# Patient Record
Sex: Male | Born: 1990
Health system: Southern US, Community
[De-identification: ages and names within clinical notes are randomized; demographics above are authoritative.]

## PROBLEM LIST (undated history)

## (undated) DIAGNOSIS — F329 Major depressive disorder, single episode, unspecified: Secondary | ICD-10-CM

## (undated) DIAGNOSIS — E079 Disorder of thyroid, unspecified: Secondary | ICD-10-CM

## (undated) DIAGNOSIS — E0500 Thyrotoxicosis with diffuse goiter without thyrotoxic crisis or storm: Secondary | ICD-10-CM

## (undated) DIAGNOSIS — F319 Bipolar disorder, unspecified: Secondary | ICD-10-CM

## (undated) DIAGNOSIS — F32A Depression, unspecified: Secondary | ICD-10-CM

## (undated) HISTORY — PX: ANKLE FRACTURE SURGERY: SHX122

---
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
## 2017-07-21 ENCOUNTER — Emergency Department (HOSPITAL_COMMUNITY): Payer: BLUE CROSS/BLUE SHIELD

## 2017-07-21 ENCOUNTER — Encounter (HOSPITAL_COMMUNITY): Payer: Self-pay

## 2017-07-21 ENCOUNTER — Other Ambulatory Visit: Payer: Self-pay

## 2017-07-21 ENCOUNTER — Emergency Department (HOSPITAL_COMMUNITY)
Admission: EM | Admit: 2017-07-21 | Discharge: 2017-07-21 | Disposition: A | Discharge status: Home / Self Care | Payer: BLUE CROSS/BLUE SHIELD | Attending: Physician Assistant | Admitting: Physician Assistant

## 2017-07-21 DIAGNOSIS — Z79899 Other long term (current) drug therapy: Secondary | ICD-10-CM | POA: Diagnosis not present

## 2017-07-21 DIAGNOSIS — R42 Dizziness and giddiness: Secondary | ICD-10-CM

## 2017-07-21 DIAGNOSIS — F17210 Nicotine dependence, cigarettes, uncomplicated: Secondary | ICD-10-CM | POA: Diagnosis not present

## 2017-07-21 DIAGNOSIS — F419 Anxiety disorder, unspecified: Secondary | ICD-10-CM | POA: Diagnosis present

## 2017-07-21 DIAGNOSIS — R0789 Other chest pain: Secondary | ICD-10-CM

## 2017-07-21 HISTORY — DX: Major depressive disorder, single episode, unspecified: F32.9

## 2017-07-21 HISTORY — DX: Depression, unspecified: F32.A

## 2017-07-21 HISTORY — DX: Bipolar disorder, unspecified: F31.9

## 2017-07-21 LAB — CBC WITH DIFFERENTIAL/PLATELET
Basophils Absolute: 0 10*3/uL (ref 0.0–0.1)
Basophils Relative: 1 %
Eosinophils Absolute: 0.1 10*3/uL (ref 0.0–0.7)
Eosinophils Relative: 1 %
HCT: 43.1 % (ref 39.0–52.0)
Hemoglobin: 14.9 g/dL (ref 13.0–17.0)
Lymphocytes Relative: 37 %
Lymphs Abs: 2.3 10*3/uL (ref 0.7–4.0)
MCH: 31.2 pg (ref 26.0–34.0)
MCHC: 34.6 g/dL (ref 30.0–36.0)
MCV: 90.2 fL (ref 78.0–100.0)
Monocytes Absolute: 0.7 10*3/uL (ref 0.1–1.0)
Monocytes Relative: 12 %
Neutro Abs: 3.1 10*3/uL (ref 1.7–7.7)
Neutrophils Relative %: 49 %
Platelets: 307 10*3/uL (ref 150–400)
RBC: 4.78 MIL/uL (ref 4.22–5.81)
RDW: 12.4 % (ref 11.5–15.5)
WBC: 6.2 10*3/uL (ref 4.0–10.5)

## 2017-07-21 LAB — COMPREHENSIVE METABOLIC PANEL
ALT: 18 U/L (ref 17–63)
AST: 26 U/L (ref 15–41)
Albumin: 4.4 g/dL (ref 3.5–5.0)
Alkaline Phosphatase: 89 U/L (ref 38–126)
Anion gap: 8 (ref 5–15)
BUN: 9 mg/dL (ref 6–20)
CO2: 24 mmol/L (ref 22–32)
Calcium: 9.6 mg/dL (ref 8.9–10.3)
Chloride: 105 mmol/L (ref 101–111)
Creatinine, Ser: 1.13 mg/dL (ref 0.61–1.24)
GFR calc Af Amer: >60 mL/min (ref >60)
GFR calc non Af Amer: >60 mL/min (ref >60)
Glucose, Bld: 99 mg/dL (ref 65–99)
Potassium: 4.1 mmol/L (ref 3.5–5.1)
Sodium: 137 mmol/L (ref 135–145)
Total Bilirubin: 0.6 mg/dL (ref 0.3–1.2)
Total Protein: 7.5 g/dL (ref 6.5–8.1)

## 2017-07-21 LAB — I-STAT TROPONIN, ED: Troponin i, poc: 0 ng/mL (ref 0.00–0.08)

## 2017-07-21 MED ORDER — HYDROXYZINE HCL 25 MG PO TABS: 25.0000 mg | ORAL_TABLET | Freq: Four times a day (QID) | ORAL | 0 refills | Status: DC

## 2017-07-21 MED ORDER — HYDROXYZINE HCL 25 MG PO TABS
25.0000 mg | ORAL_TABLET | Freq: Once | ORAL | Status: AC
  Administered 2017-07-21: 25 mg via ORAL
  Filled 2017-07-21: qty 1

## 2017-07-21 NOTE — ED Notes (Signed)
Pt discharged from ED; instructions provided and scripts given; Pt encouraged to return to ED if symptoms worsen and to f/u with PCP; Pt verbalized understanding of all instructions 

## 2017-07-21 NOTE — ED Provider Notes (Signed)
[facility]   CSN: [id number] Arrival date & time: 07/21/17  1502     History   Chief Complaint Chief Complaint  Patient presents with  . Anxiety    HPI Ralph Harrell is a 27 y.o. male.  Ralph Harrell is a 27 y.o. Male with a history of bipolar 1 disorder, depression and anxiety, who presents to the ED for evaluation of some increased anxiety with some intermittent chest pains and feeling dizzy.  Patient reports he has a pain that starts at the top of his left chest and he feels like it "flushes downward" and then he starts to feel slightly lightheaded, but denies any syncopal episodes.  He reports this episode has happened a few times over the past 2 days and he has been feeling increasingly anxious.  Patient reports some occasional tingling that is not focal.  He reports feeling a little bit queasy over the past few days but has not had any episodes of vomiting currently he denies any chest pain, shortness of breath, abdominal pain he has not had any fevers or chills, no cough.  Patient reports in the past he has been on Prozac and Wellbutrin to help manage his mental health but he has been out without these medications for about a year and a half due to lack of insurance.  No prior history of cardiac events or heart problems     Past Medical History:  Diagnosis Date  . Bipolar 1 disorder (HCC)   . Depression     There are no active problems to display for this patient.   History reviewed. No pertinent surgical history.      Home Medications    Prior to Admission medications   Medication Sig Start Date End Date Taking? Authorizing Provider  acetaminophen (TYLENOL) 500 MG tablet Take 1,000 mg by mouth every 6 (six) hours as needed for headache (pain).   Yes [provider]  ibuprofen (ADVIL,MOTRIN) 200 MG tablet Take 600 mg by mouth every 6 (six) hours as needed for headache (pain).   Yes [provider]  Multiple Vitamin (MULTIVITAMIN WITH MINERALS) TABS tablet Take 1 tablet by mouth daily.   Yes [provider]  OVER THE COUNTER MEDICATION Kre-Alkalyn (creatine supplement) - take 2 capsules by mouth before workouts (3 or 4 times weekly)   Yes [provider]    Family History No family history on file.  Social History Social History   Tobacco Use  . Smoking status: Current Every Day Smoker    Types: Cigarettes  . Smokeless tobacco: Never Used  Substance Use Topics  . Alcohol use: Yes  . Drug use: Not Currently     Allergies   Patient has no known allergies.   Review of Systems Review of Systems  Constitutional: Negative for chills and fever.  HENT: Negative for congestion, rhinorrhea and sore throat.   Eyes: Negative for visual disturbance.  Respiratory: Positive for chest tightness. Negative for shortness of breath and wheezing.   Cardiovascular: Positive for chest pain. Negative for palpitations and leg swelling.  Gastrointestinal: Positive for nausea. Negative for abdominal pain and vomiting.  Genitourinary: Negative for dysuria and frequency.  Musculoskeletal: Negative for arthralgias and myalgias.  Skin: Negative for color change and rash.  Neurological: Positive for light-headedness. Negative for dizziness, syncope, weakness, numbness and headaches.  Psychiatric/Behavioral: The patient is nervous/anxious.      Physical Exam Updated Vital Signs BP (!) 141/81  Pulse 68   Temp 97.6 F (36.4 C) (Oral)   Resp 16   Ht 5\' 11"  (1.803 m)   Wt 71.7 kg (158 lb)   SpO2 100%   BMI 22.04 kg/m   Physical Exam  Constitutional: He appears well-developed and well-nourished. No distress.  Appears anxious but is in no acute distress  HENT:  Head: Normocephalic and atraumatic.  Mouth/Throat: Oropharynx is clear and moist.  Eyes: Right eye exhibits no discharge. Left eye exhibits no discharge.  Neck: Normal range of motion. Neck supple.   Cardiovascular: Normal rate, regular rhythm, normal heart sounds and intact distal pulses. Exam reveals no gallop and no friction rub.  No murmur heard. Pulses:      Radial pulses are 2+ on the right side, and 2+ on the left side.       Dorsalis pedis pulses are 2+ on the right side, and 2+ on the left side.  Pulmonary/Chest: Effort normal and breath sounds normal. No stridor. No respiratory distress. He has no wheezes. He has no rales.  Respirations equal and unlabored, patient able to speak in full sentences, lungs clear to auscultation bilaterally  Abdominal: Soft. Bowel sounds are normal. He exhibits no distension and no mass. There is no tenderness. There is no guarding.  Neurological: He is alert. Coordination normal.  Skin: He is not diaphoretic.  Psychiatric: His behavior is normal. His mood appears anxious.  Nursing note and vitals reviewed.    ED Treatments / Results  Labs (all labs ordered are listed, but only abnormal results are displayed) Labs Reviewed  COMPREHENSIVE METABOLIC PANEL  CBC WITH DIFFERENTIAL/PLATELET  I-STAT TROPONIN, ED    EKG EKG Interpretation  Date/Time:  Saturday July 21 2017 15:14:58 EDT Ventricular Rate:  69 PR Interval:  130 QRS Duration: 100 QT Interval:  380 QTC Calculation: 407 R Axis:   83 Text Interpretation:  Normal sinus rhythm Normal ECG Normal sinus rhythm Confirmed by Corlis Leak, Courteney ([id number]) on 07/21/2017 6:16:20 PM   Radiology Dg Chest 2 View  Result Date: 07/21/2017 CLINICAL DATA:  Pt arrived from home with c/o feeling like he was going to pass out. Pt states he feels this way when sitting standing or laying and reports he has been feeling more anxious. Pt also c/o tingling on left side since yesterday off and on. EXAM: CHEST - 2 VIEW COMPARISON:  06/16/2008 FINDINGS: The heart size and mediastinal contours are within normal limits. Both lungs are clear. The visualized skeletal structures are unremarkable. IMPRESSION: No  active cardiopulmonary disease. Electronically Signed   By: Norva Pavlov M.D.   On: 07/21/2017 17:25    Procedures Procedures (including critical care time)  Medications Ordered in ED Medications  hydrOXYzine (ATARAX/VISTARIL) tablet 25 mg (25 mg Oral Given 07/21/17 1841)     Initial Impression / Assessment and Plan / ED Course  I have reviewed the triage vital signs and the nursing notes.  Pertinent labs & imaging results that were available during my care of the patient were reviewed by me and considered in my medical decision making (see chart for details).  Patient presents with very atypical chest pain intermittently over the past 2 days that starts at the left apex of the chest wall and flushes downwards, this is associated with increased anxiety patient has been experiencing some intermittent dizziness.  Vitals are normal here in the ED patient is not orthostatic.  Chest pain is not likely of cardiac or pulmonary etiology d/t presentation, PERC negative, VSS, no  tracheal deviation, no JVD or new murmur, RRR, breath sounds equal bilaterally, EKG without acute abnormalities, negative troponin, and negative CXR.  Patient definitely appears anxious on exam has been off of his psychiatric medications for the last year and a half due to insurance issues but recently got back on his insurance and will be following up with his primary care doctor.  He was given a dose of Vistaril here today which seemed to help, will discharge with short course of this medication to trial at home.  Patient is to be discharged with recommendation to follow up with PCP in regards to today's hospital visit. Pt has been advised to return to the ED if CP becomes exertional, associated with diaphoresis or nausea, radiates to left jaw/arm, worsens or becomes concerning in any way. Pt appears reliable for follow up and is agreeable to discharge.     Final Clinical Impressions(s) / ED Diagnoses   Final diagnoses:    Atypical chest pain  Lightheadedness  Anxiety    ED Discharge Orders        Ordered    hydrOXYzine (ATARAX/VISTARIL) 25 MG tablet  Every 6 hours     07/21/17 1841       Dartha Lodge, PA-C 07/21/17 1850    Abelino Derrick, MD 07/21/17 2337

## 2017-07-21 NOTE — ED Notes (Signed)
Pt given sandwich bag and coke per Tray(RN)

## 2017-07-21 NOTE — Discharge Instructions (Addendum)
Your evaluation today is reassuring, labs, EKG and chest x-ray do not show any acute heart or lung problem causing your symptoms, anxiety may be contributing, you can continue to use hydroxyzine as needed to help with symptoms.  Please follow-up with your primary care doctor for continued management of this and to help get back on your regular medications.  Return to the ED for worsening chest pain especially if it is present with exertion or radiates to the arm neck or jaw, shortness of breath, fevers or chills, you pass out, or any other new or concerning symptoms.

## 2017-07-21 NOTE — ED Triage Notes (Signed)
Pt arrived from home with c/o feeling like he was going to pass out. Pt states he feels this way when sitting standing or laying and reports he has been feeling more anxious. Pt also c/o tingling on left side since yesterday off and on.

## 2017-12-14 ENCOUNTER — Other Ambulatory Visit: Payer: Self-pay

## 2017-12-14 ENCOUNTER — Encounter (HOSPITAL_COMMUNITY): Payer: Self-pay | Admitting: *Deleted

## 2017-12-14 ENCOUNTER — Emergency Department (HOSPITAL_COMMUNITY)
Admission: EM | Admit: 2017-12-14 | Discharge: 2017-12-14 | Discharge status: Against Medical Advice | Payer: BLUE CROSS/BLUE SHIELD | Attending: Emergency Medicine | Admitting: Emergency Medicine

## 2017-12-14 DIAGNOSIS — R110 Nausea: Secondary | ICD-10-CM | POA: Diagnosis not present

## 2017-12-14 DIAGNOSIS — Z5321 Procedure and treatment not carried out due to patient leaving prior to being seen by health care provider: Secondary | ICD-10-CM | POA: Insufficient documentation

## 2017-12-14 DIAGNOSIS — H9202 Otalgia, left ear: Secondary | ICD-10-CM | POA: Insufficient documentation

## 2017-12-14 NOTE — ED Triage Notes (Signed)
Pt in c/o left earache and drainage for the last few days, states his ear feels full, also had a heat flash this morning that started in the bottom of his stomach and radiated up, also reports increased burping, and nausea after drinking water

## 2017-12-19 ENCOUNTER — Other Ambulatory Visit: Payer: Self-pay

## 2017-12-19 ENCOUNTER — Encounter (HOSPITAL_COMMUNITY): Payer: Self-pay | Admitting: Emergency Medicine

## 2017-12-19 ENCOUNTER — Emergency Department (HOSPITAL_COMMUNITY)
Admission: EM | Admit: 2017-12-19 | Discharge: 2017-12-19 | Disposition: A | Discharge status: Home / Self Care | Payer: BLUE CROSS/BLUE SHIELD | Attending: Emergency Medicine | Admitting: Emergency Medicine

## 2017-12-19 DIAGNOSIS — Z79899 Other long term (current) drug therapy: Secondary | ICD-10-CM | POA: Insufficient documentation

## 2017-12-19 DIAGNOSIS — R42 Dizziness and giddiness: Secondary | ICD-10-CM

## 2017-12-19 DIAGNOSIS — R55 Syncope and collapse: Secondary | ICD-10-CM | POA: Diagnosis present

## 2017-12-19 DIAGNOSIS — F17210 Nicotine dependence, cigarettes, uncomplicated: Secondary | ICD-10-CM | POA: Diagnosis not present

## 2017-12-19 LAB — URINALYSIS, ROUTINE W REFLEX MICROSCOPIC
Bilirubin Urine: NEGATIVE
GLUCOSE, UA: NEGATIVE mg/dL
Hgb urine dipstick: NEGATIVE
Ketones, ur: NEGATIVE mg/dL
LEUKOCYTES UA: NEGATIVE
Nitrite: NEGATIVE
PH: 8 (ref 5.0–8.0)
Protein, ur: NEGATIVE mg/dL
SPECIFIC GRAVITY, URINE: 1.001 — AB (ref 1.005–1.030)

## 2017-12-19 LAB — BASIC METABOLIC PANEL
Anion gap: 14 (ref 5–15)
BUN: 9 mg/dL (ref 6–20)
CHLORIDE: 104 mmol/L (ref 98–111)
CO2: 20 mmol/L — ABNORMAL LOW (ref 22–32)
Calcium: 9.8 mg/dL (ref 8.9–10.3)
Creatinine, Ser: 1.25 mg/dL — ABNORMAL HIGH (ref 0.61–1.24)
GFR calc Af Amer: >60 mL/min (ref >60)
GFR calc non Af Amer: >60 mL/min (ref >60)
Glucose, Bld: 92 mg/dL (ref 70–99)
POTASSIUM: 3.9 mmol/L (ref 3.5–5.1)
SODIUM: 138 mmol/L (ref 135–145)

## 2017-12-19 LAB — CBC
HEMATOCRIT: 46.7 % (ref 39.0–52.0)
Hemoglobin: 16.7 g/dL (ref 13.0–17.0)
MCH: 31.3 pg (ref 26.0–34.0)
MCHC: 35.8 g/dL (ref 30.0–36.0)
MCV: 87.6 fL (ref 78.0–100.0)
Platelets: 322 10*3/uL (ref 150–400)
RBC: 5.33 MIL/uL (ref 4.22–5.81)
RDW: 11.7 % (ref 11.5–15.5)
WBC: 7.2 10*3/uL (ref 4.0–10.5)

## 2017-12-19 LAB — RAPID URINE DRUG SCREEN, HOSP PERFORMED
AMPHETAMINES: NOT DETECTED
BARBITURATES: NOT DETECTED
BENZODIAZEPINES: NOT DETECTED
Cocaine: NOT DETECTED
Opiates: NOT DETECTED
TETRAHYDROCANNABINOL: NOT DETECTED

## 2017-12-19 MED ORDER — LACTATED RINGERS IV BOLUS
1000.0000 mL | Freq: Once | INTRAVENOUS | Status: AC
  Administered 2017-12-19: 1000 mL via INTRAVENOUS

## 2017-12-19 NOTE — ED Notes (Signed)
Pt refused IVF, given ice water PO, tolerating well

## 2017-12-19 NOTE — ED Notes (Signed)
No E-signature available; pt is stable for discharge and states understanding of discharge instructions  

## 2017-12-19 NOTE — ED Notes (Signed)
ED Provider at bedside. 

## 2017-12-19 NOTE — ED Triage Notes (Signed)
Pt presents with [facility] from work with reports of syncopal episodes x 2 while driving to work this morning; pt denies wrecking vehicle or injury at this time; also c/o dizziness and blurred vision x 2 wks that intermittently improves; hx of anxiety and bipolar and takes prozac and welbutrin; states he has had some increased stressors lately and no quality sleep  Pt also adds c/o intermittent constipation and can sometimes taste stool in his mouth and his perspiration smells like stool; last BM yesterday

## 2017-12-20 NOTE — ED Provider Notes (Signed)
[facility] Provider [facility]   CSN: [id number] Arrival date & time: 12/19/17  1527     History   Chief Complaint Chief Complaint  Patient presents with  . Loss of Consciousness  . Dizziness  . Anxiety    HPI Ralph Harrell is a 27 y.o. male.  Patient had a couple episodes where he fell exam pass out and then lost memory for short period of time.  1 of having was driving when having noise walking.  He attended he hit his head or do anything else abnormal.   Loss of Consciousness   This is a new problem. The current episode started 1 to 2 hours ago. The problem has been resolved. There was no loss of consciousness. The problem is associated with normal activity. Associated symptoms include dizziness.  Dizziness  Associated symptoms: syncope   Anxiety     Past Medical History:  Diagnosis Date  . Bipolar 1 disorder (HCC)   . Depression     There are no active problems to display for this patient.   History reviewed. No pertinent surgical history.      Home Medications    Prior to Admission medications   Medication Sig Start Date End Date Taking? Authorizing Provider  acetaminophen (TYLENOL) 500 MG tablet Take 1,000 mg by mouth every 6 (six) hours as needed for headache (pain).   Yes [provider]  buPROPion (WELLBUTRIN SR) 150 MG 12 hr tablet Take 150 mg by mouth every 12 (twelve) hours. 12/14/17  Yes [provider]  FLUoxetine (PROZAC) 40 MG capsule Take 40 mg by mouth daily. 12/14/17  Yes [provider]  ibuprofen (ADVIL,MOTRIN) 200 MG tablet Take 600 mg by mouth every 6 (six) hours as needed for headache (pain).   Yes [provider]  OVER THE COUNTER MEDICATION Kre-Alkalyn (creatine supplement) - take 2 capsules by mouth before workouts (3 or 4 times weekly)    [provider]    Family History No family history on file.  Social History Social History   Tobacco Use  .  Smoking status: Current Every Day Smoker    Types: Cigarettes  . Smokeless tobacco: Never Used  Substance Use Topics  . Alcohol use: Yes  . Drug use: Not Currently     Allergies   Patient has no known allergies.   Review of Systems Review of Systems  Cardiovascular: Positive for syncope.  Neurological: Positive for dizziness.  Psychiatric/Behavioral: Negative for suicidal ideas. The patient is nervous/anxious.   All other systems reviewed and are negative.    Physical Exam Updated Vital Signs BP 128/80 (BP Location: Right Arm)   Pulse 68   Temp 98.1 F (36.7 C) (Oral)   Resp 16   SpO2 99%   Physical Exam  Constitutional: He appears well-developed and well-nourished.  HENT:  Head: Normocephalic and atraumatic.  Neck: Normal range of motion.  Cardiovascular: Normal rate.  Pulmonary/Chest: Effort normal. No respiratory distress.  Abdominal: He exhibits no distension.  Musculoskeletal: Normal range of motion.  Neurological: He is alert.  Nursing note and vitals reviewed.    ED Treatments / Results  Labs (all labs ordered are listed, but only abnormal results are displayed) Labs Reviewed  BASIC METABOLIC PANEL - Abnormal; Notable for the following components:      Result Value   CO2 20 (*)    Creatinine, Ser 1.25 (*)    All other components within normal limits  URINALYSIS, ROUTINE W  REFLEX MICROSCOPIC - Abnormal; Notable for the following components:   Color, Urine COLORLESS (*)    Specific Gravity, Urine 1.001 (*)    All other components within normal limits  CBC  RAPID URINE DRUG SCREEN, HOSP PERFORMED  CBG MONITORING, ED    EKG None  Radiology No results found.  Procedures Procedures (including critical care time)  Medications Ordered in ED Medications  lactated ringers bolus 1,000 mL (0 mLs Intravenous Stopped 12/19/17 1929)     Initial Impression / Assessment and Plan / ED Course  I have reviewed the triage vital signs and the nursing  notes.  Pertinent labs & imaging results that were available during my care of the patient were reviewed by me and considered in my medical decision making (see chart for details).     Low suspicion for any cardiac causes for his symptoms.  No neuro deficits to suspect that related.  Patient thinks that the stress and anxiety might be related to his near syncopal episodes and if this is possible however I encouraged him not to drive and to follow-up with his primary doctor to get further evaluation before deciding on that.  Final Clinical Impressions(s) / ED Diagnoses   Final diagnoses:  Dizziness  Syncope, unspecified syncope type    ED Discharge Orders    None       Terrian Ridlon, Barbara Cower, MD 12/20/17 [phone]

## 2017-12-25 ENCOUNTER — Other Ambulatory Visit (HOSPITAL_COMMUNITY): Payer: Self-pay | Admitting: Family Medicine

## 2017-12-25 DIAGNOSIS — E0590 Thyrotoxicosis, unspecified without thyrotoxic crisis or storm: Secondary | ICD-10-CM

## 2018-01-02 ENCOUNTER — Encounter (HOSPITAL_COMMUNITY)
Admission: RE | Admit: 2018-01-02 | Discharge: 2018-01-02 | Disposition: A | Discharge status: Home / Self Care | Payer: BLUE CROSS/BLUE SHIELD | Source: Ambulatory Visit | Attending: Family Medicine | Admitting: Family Medicine

## 2018-01-02 DIAGNOSIS — E0590 Thyrotoxicosis, unspecified without thyrotoxic crisis or storm: Secondary | ICD-10-CM | POA: Diagnosis not present

## 2018-01-03 ENCOUNTER — Encounter (HOSPITAL_COMMUNITY)
Admission: RE | Admit: 2018-01-03 | Discharge: 2018-01-03 | Disposition: A | Discharge status: Home / Self Care | Payer: BLUE CROSS/BLUE SHIELD | Source: Ambulatory Visit | Attending: Family Medicine | Admitting: Family Medicine

## 2018-01-30 ENCOUNTER — Encounter: Payer: Self-pay | Admitting: Endocrinology

## 2018-01-30 ENCOUNTER — Ambulatory Visit (INDEPENDENT_AMBULATORY_CARE_PROVIDER_SITE_OTHER): Payer: BLUE CROSS/BLUE SHIELD | Admitting: Endocrinology

## 2018-01-30 DIAGNOSIS — E0590 Thyrotoxicosis, unspecified without thyrotoxic crisis or storm: Secondary | ICD-10-CM

## 2018-01-30 DIAGNOSIS — F419 Anxiety disorder, unspecified: Secondary | ICD-10-CM

## 2018-01-30 LAB — T4, FREE: Free T4: 0.97 ng/dL (ref 0.60–1.60)

## 2018-01-30 LAB — TSH: TSH: 0.79 u[IU]/mL (ref 0.35–4.50)

## 2018-01-30 NOTE — Progress Notes (Signed)
Subjective:    Patient ID: Ralph Harrell, male    DOB: 1990/04/08, 27 y.o.   MRN: [id number]  HPI Pt is referred by Lonie Peak, PA, for hyperthyroidism.  Pt reports he was dx'ed with hyperthyroidism 1 month ago.  He took tapazole for just a few days.  He has never had XRT to the anterior neck, or thyroid surgery.  He does not consume kelp or any other non-prescribed thyroid medication.  He has never been on amiodarone.  He reports moderate weight loss (20 lb), and assoc tremor.  sxs are improved on atenolol Past Medical History:  Diagnosis Date  . Bipolar 1 disorder (HCC)   . Depression     No past surgical history on file.  Social History   Socioeconomic History  . Marital status: Married    Spouse name: Not on file  . Number of children: Not on file  . Years of education: Not on file  . Highest education level: Not on file  Occupational History  . Not on file  Social Needs  . Financial resource strain: Not on file  . Food insecurity:    Worry: Not on file    Inability: Not on file  . Transportation needs:    Medical: Not on file    Non-medical: Not on file  Tobacco Use  . Smoking status: Current Every Day Smoker    Types: Cigarettes  . Smokeless tobacco: Never Used  Substance and Sexual Activity  . Alcohol use: Yes  . Drug use: Not Currently  . Sexual activity: Not on file  Lifestyle  . Physical activity:    Days per week: Not on file    Minutes per session: Not on file  . Stress: Not on file  Relationships  . Social connections:    Talks on phone: Not on file    Gets together: Not on file    Attends religious service: Not on file    Active member of club or organization: Not on file    Attends meetings of clubs or organizations: Not on file    Relationship status: Not on file  . Intimate partner violence:    Fear of current or ex partner: Not on file    Emotionally abused: Not on file    Physically abused: Not on file    Forced sexual activity: Not  on file  Other Topics Concern  . Not on file  Social History Narrative  . Not on file    Current Outpatient Medications on File Prior to Visit  Medication Sig Dispense Refill  . acetaminophen (TYLENOL) 500 MG tablet Take 1,000 mg by mouth every 6 (six) hours as needed for headache (pain).    Marland Kitchen atenolol (TENORMIN) 25 MG tablet Take by mouth daily.    Marland Kitchen buPROPion (WELLBUTRIN SR) 150 MG 12 hr tablet Take 150 mg by mouth daily.   1  . FLUoxetine (PROZAC) 40 MG capsule Take 40 mg by mouth daily.  1  . hydrOXYzine (ATARAX/VISTARIL) 50 MG tablet Take 50 mg by mouth every 4 (four) hours as needed.    Marland Kitchen ibuprofen (ADVIL,MOTRIN) 200 MG tablet Take 600 mg by mouth every 6 (six) hours as needed for headache (pain).     No current facility-administered medications on file prior to visit.     No Known Allergies  Family History  Problem Relation Age of Onset  . Thyroid disease Maternal Grandmother     BP 122/72 (BP Location: Left Arm, Patient  Position: Sitting, Cuff Size: Normal)   Pulse 71   Ht 5\' 11"  (1.803 m)   Wt 144 lb (65.3 kg)   SpO2 98%   BMI 20.08 kg/m   Review of Systems denies fever, headache, hoarseness, diplopia, sob, diarrhea, nocturia, muscle weakness, edema, excessive diaphoresis, heat intolerance, easy bruising, and rhinorrhea. He has palpitations, heartburn, and anxiety.     Objective:   Physical Exam VS: see vs page GEN: no distress HEAD: head: no deformity eyes: no periorbital swelling, no proptosis external nose and ears are normal mouth: no lesion seen NECK: supple, thyroid is not enlarged CHEST WALL: no deformity LUNGS: clear to auscultation CV: reg rate and rhythm, no murmur ABD: abdomen is soft, nontender.  no hepatosplenomegaly.  not distended.  no hernia MUSCULOSKELETAL: muscle bulk and strength are grossly normal.  no obvious joint swelling.  gait is normal and steady EXTEMITIES: no deformity.  no edema PULSES: no carotid bruit NEURO:  cn 2-12  grossly intact.   readily moves all 4's.  sensation is intact to touch on all 4's.  Slight tremor of the hands.  SKIN:  Normal texture and temperature.  No rash or suspicious lesion is visible.  Slight diaphoretic.  NODES:  None palpable at the neck PSYCH: alert, well-oriented.  Does not appear anxious nor depressed.     nuc med scan: Mildly elevated 24 hour radio iodine uptake consistent with Hyperthyroidism. Normal thyroid scan.  Findings consistent with Graves disease.  I have reviewed outside records, and summarized: Pt was noted to have suppressed TSH, and referred here.  Main problem addressed was anxiety.  It was uncertain to what extent this was related to the thyroid.       Assessment & Plan:  Grave's Dz, new to me Hyperthyroidism, due to the Grave's Dz.  Mild.  We should recheck prior to rx.  Patient Instructions  blood tests are requested for you today.  We'll let you know about the results. If it is overactive again, i'll prescribe for you the methimazole pill. If ever you have fever while taking methimazole, stop it and call us, even if the reason is obvious, because of the risk of a rare side-effect. Please come back for a follow-up appointment in 1 month.

## 2018-01-30 NOTE — Patient Instructions (Signed)
blood tests are requested for you today.  We'll let you know about the results. If it is overactive again, i'll prescribe for you the methimazole pill. If ever you have fever while taking methimazole, stop it and call us, even if the reason is obvious, because of the risk of a rare side-effect. Please come back for a follow-up appointment in 1 month.

## 2018-02-21 ENCOUNTER — Encounter: Payer: Self-pay | Admitting: Endocrinology

## 2018-02-21 ENCOUNTER — Ambulatory Visit (INDEPENDENT_AMBULATORY_CARE_PROVIDER_SITE_OTHER): Payer: BLUE CROSS/BLUE SHIELD | Admitting: Endocrinology

## 2018-02-21 VITALS — BP 110/78 | HR 66 | Ht 71.0 in | Wt 147.0 lb

## 2018-02-21 DIAGNOSIS — E0590 Thyrotoxicosis, unspecified without thyrotoxic crisis or storm: Secondary | ICD-10-CM | POA: Diagnosis not present

## 2018-02-21 LAB — TSH: TSH: 0.7 u[IU]/mL (ref 0.35–4.50)

## 2018-02-21 LAB — T4, FREE: FREE T4: 0.92 ng/dL (ref 0.60–1.60)

## 2018-02-21 NOTE — Patient Instructions (Signed)
blood tests are requested for you today.  We'll let you know about the results.  If these tests are normal, please come back for a follow-up appointment in 3-4 months.

## 2018-02-21 NOTE — Progress Notes (Signed)
Subjective:    Patient ID: Ralph Harrell, male    DOB: March 03, 1991, 27 y.o.   MRN: [id number]  HPI Pt returns for f/u of hyperthyroidism (dx'ed  2019; nuc med showed diffuse uptake with hyperthyroidism 1 month ago; he took tapazole for just a few days).  pt states he feels well in general, except for insomnia.   Past Medical History:  Diagnosis Date  . Bipolar 1 disorder (HCC)   . Depression     No past surgical history on file.  Social History   Socioeconomic History  . Marital status: Married    Spouse name: Not on file  . Number of children: Not on file  . Years of education: Not on file  . Highest education level: Not on file  Occupational History  . Not on file  Social Needs  . Financial resource strain: Not on file  . Food insecurity:    Worry: Not on file    Inability: Not on file  . Transportation needs:    Medical: Not on file    Non-medical: Not on file  Tobacco Use  . Smoking status: Current Every Day Smoker    Types: Cigarettes  . Smokeless tobacco: Never Used  Substance and Sexual Activity  . Alcohol use: Yes  . Drug use: Not Currently  . Sexual activity: Not on file  Lifestyle  . Physical activity:    Days per week: Not on file    Minutes per session: Not on file  . Stress: Not on file  Relationships  . Social connections:    Talks on phone: Not on file    Gets together: Not on file    Attends religious service: Not on file    Active member of club or organization: Not on file    Attends meetings of clubs or organizations: Not on file    Relationship status: Not on file  . Intimate partner violence:    Fear of current or ex partner: Not on file    Emotionally abused: Not on file    Physically abused: Not on file    Forced sexual activity: Not on file  Other Topics Concern  . Not on file  Social History Narrative  . Not on file    Current Outpatient Medications on File Prior to Visit  Medication Sig Dispense Refill  . acetaminophen  (TYLENOL) 500 MG tablet Take 1,000 mg by mouth every 6 (six) hours as needed for headache (pain).    Marland Kitchen atenolol (TENORMIN) 25 MG tablet Take by mouth daily.    Marland Kitchen buPROPion (WELLBUTRIN SR) 150 MG 12 hr tablet Take 150 mg by mouth daily.   1  . FLUoxetine (PROZAC) 40 MG capsule Take 40 mg by mouth daily.  1  . hydrOXYzine (ATARAX/VISTARIL) 50 MG tablet Take 50 mg by mouth every 4 (four) hours as needed.    Marland Kitchen ibuprofen (ADVIL,MOTRIN) 200 MG tablet Take 600 mg by mouth every 6 (six) hours as needed for headache (pain).     No current facility-administered medications on file prior to visit.     No Known Allergies  Family History  Problem Relation Age of Onset  . Thyroid disease Maternal Grandmother     BP 110/78 (BP Location: Left Arm, Patient Position: Sitting, Cuff Size: Normal)   Pulse 66   Ht 5\' 11"  (1.803 m)   Wt 147 lb (66.7 kg)   SpO2 97%   BMI 20.50 kg/m   Review of Systems No  fever    Objective:   Physical Exam VITAL SIGNS:  See vs page GENERAL: no distress NECK: There is no palpable thyroid enlargement.  No thyroid nodule is palpable.  No palpable lymphadenopathy at the anterior neck.   Lab Results  Component Value Date   TSH 0.70 02/21/2018      Assessment & Plan:  Hyperthyroidism, resolved, but he is at risk for recurrence Insomnia: not thyroid-related  Patient Instructions  blood tests are requested for you today.  We'll let you know about the results.  If these tests are normal, please come back for a follow-up appointment in 3-4 months.

## 2018-05-30 ENCOUNTER — Telehealth: Payer: Self-pay | Admitting: Endocrinology

## 2018-05-30 ENCOUNTER — Ambulatory Visit: Payer: BLUE CROSS/BLUE SHIELD | Admitting: Endocrinology

## 2018-05-30 NOTE — Telephone Encounter (Signed)
Please schedule f/u appt for next available appointment  

## 2018-05-30 NOTE — Telephone Encounter (Signed)
Patient no showed today's appt. Please advise on how to follow up. °A. No follow up necessary. °B. Follow up urgent. Contact patient immediately. °C. Follow up necessary. Contact patient and schedule visit in ___ days. °D. Follow up advised. Contact patient and schedule visit in ____weeks. ° °Would you like the NS fee to be applied to this visit? ° °

## 2018-05-30 NOTE — Telephone Encounter (Signed)
Please refer to Dr. Ellison's response 

## 2018-05-31 NOTE — Telephone Encounter (Signed)
Rescheduled missed appointment for 06/03/18 at 3:00 pm

## 2018-06-03 ENCOUNTER — Ambulatory Visit: Payer: Self-pay | Admitting: Endocrinology

## 2018-06-03 ENCOUNTER — Telehealth: Payer: Self-pay | Admitting: Endocrinology

## 2018-06-03 NOTE — Telephone Encounter (Signed)
Please schedule f/u appt for next available appointment  

## 2018-06-03 NOTE — Telephone Encounter (Signed)
Patient no showed today's appt. Please advise on how to follow up. °A. No follow up necessary. °B. Follow up urgent. Contact patient immediately. °C. Follow up necessary. Contact patient and schedule visit in ___ days. °D. Follow up advised. Contact patient and schedule visit in ____weeks. ° °Would you like the NS fee to be applied to this visit? ° °

## 2018-06-04 NOTE — Telephone Encounter (Signed)
Please refer to Dr. Ellison's response 

## 2018-06-04 NOTE — Telephone Encounter (Signed)
Rescheduled patient's missed appointment for 06/05/18 at 2:45 p.m.

## 2018-06-05 ENCOUNTER — Encounter: Payer: Self-pay | Admitting: Endocrinology

## 2018-06-05 ENCOUNTER — Ambulatory Visit (INDEPENDENT_AMBULATORY_CARE_PROVIDER_SITE_OTHER): Payer: 59 | Admitting: Endocrinology

## 2018-06-05 ENCOUNTER — Other Ambulatory Visit: Payer: Self-pay

## 2018-06-05 VITALS — BP 108/78 | HR 64 | Ht 71.0 in | Wt 148.0 lb

## 2018-06-05 DIAGNOSIS — E0590 Thyrotoxicosis, unspecified without thyrotoxic crisis or storm: Secondary | ICD-10-CM | POA: Diagnosis not present

## 2018-06-05 LAB — TSH: TSH: 0.95 u[IU]/mL (ref 0.35–4.50)

## 2018-06-05 LAB — T4, FREE: Free T4: 0.91 ng/dL (ref 0.60–1.60)

## 2018-06-05 NOTE — Progress Notes (Signed)
Subjective:    Patient ID: Ralph Harrell, male    DOB: July 03, 1990, 28 y.o.   MRN: [id number]  HPI Pt returns for f/u of hyperthyroidism (dx'ed  2019; nuc med showed diffuse uptake with hyperthyroidism 1 month ago; he took tapazole for just a few days; TSH has stayed normal of rx since then).  pt states he feels well in general, except for palpitations and anxiety.  These sxs are well-controlled on rx.   Past Medical History:  Diagnosis Date  . Bipolar 1 disorder (HCC)   . Depression     No past surgical history on file.  Social History   Socioeconomic History  . Marital status: Married    Spouse name: Not on file  . Number of children: Not on file  . Years of education: Not on file  . Highest education level: Not on file  Occupational History  . Not on file  Social Needs  . Financial resource strain: Not on file  . Food insecurity:    Worry: Not on file    Inability: Not on file  . Transportation needs:    Medical: Not on file    Non-medical: Not on file  Tobacco Use  . Smoking status: Former Smoker    Types: Cigarettes    Last attempt to quit: 04/06/2018    Years since quitting: 0.1  . Smokeless tobacco: Never Used  Substance and Sexual Activity  . Alcohol use: Yes  . Drug use: Not Currently  . Sexual activity: Not on file  Lifestyle  . Physical activity:    Days per week: Not on file    Minutes per session: Not on file  . Stress: Not on file  Relationships  . Social connections:    Talks on phone: Not on file    Gets together: Not on file    Attends religious service: Not on file    Active member of club or organization: Not on file    Attends meetings of clubs or organizations: Not on file    Relationship status: Not on file  . Intimate partner violence:    Fear of current or ex partner: Not on file    Emotionally abused: Not on file    Physically abused: Not on file    Forced sexual activity: Not on file  Other Topics Concern  . Not on file  Social  History Narrative  . Not on file    Current Outpatient Medications on File Prior to Visit  Medication Sig Dispense Refill  . acetaminophen (TYLENOL) 500 MG tablet Take 1,000 mg by mouth every 6 (six) hours as needed for headache (pain).    Marland Kitchen atenolol (TENORMIN) 25 MG tablet Take by mouth daily.    Marland Kitchen buPROPion (WELLBUTRIN SR) 150 MG 12 hr tablet Take 150 mg by mouth daily.   1  . FLUoxetine (PROZAC) 40 MG capsule Take 40 mg by mouth daily.  1  . hydrOXYzine (ATARAX/VISTARIL) 50 MG tablet Take 50 mg by mouth every 4 (four) hours as needed.    Marland Kitchen ibuprofen (ADVIL,MOTRIN) 200 MG tablet Take 600 mg by mouth every 6 (six) hours as needed for headache (pain).     No current facility-administered medications on file prior to visit.     No Known Allergies  Family History  Problem Relation Age of Onset  . Thyroid disease Maternal Grandmother     BP 108/78 (BP Location: Right Arm, Patient Position: Sitting, Cuff Size: Normal)   Pulse  64   Ht 5\' 11"  (1.803 m)   Wt 148 lb (67.1 kg)   SpO2 98%   BMI 20.64 kg/m    Review of Systems Denies excessive diaphoresis.      Objective:   Physical Exam VITAL SIGNS:  See vs page GENERAL: no distress NECK: There is no palpable thyroid enlargement.  No thyroid nodule is palpable.  No palpable lymphadenopathy at the anterior neck.    Lab Results  Component Value Date   TSH 0.95 06/05/2018      Assessment & Plan:  Hyperthyroidism: well-controlled Anxiety: not thyroid-related  Patient Instructions  Thyroid blood tests are requested for you today.  We'll let you know about the results.  If the tests are normal, you should conclude that your symptoms are not coming from the thyroid.   Please come back for a follow-up appointment in 6 months.

## 2018-06-05 NOTE — Patient Instructions (Addendum)
Thyroid blood tests are requested for you today.  We'll let you know about the results.  If the tests are normal, you should conclude that your symptoms are not coming from the thyroid.   Please come back for a follow-up appointment in 6 months.

## 2018-12-04 ENCOUNTER — Other Ambulatory Visit: Payer: Self-pay

## 2018-12-06 ENCOUNTER — Ambulatory Visit: Payer: 59 | Admitting: Endocrinology

## 2019-01-17 ENCOUNTER — Encounter (HOSPITAL_BASED_OUTPATIENT_CLINIC_OR_DEPARTMENT_OTHER): Payer: Self-pay | Admitting: Emergency Medicine

## 2019-01-17 ENCOUNTER — Emergency Department (HOSPITAL_BASED_OUTPATIENT_CLINIC_OR_DEPARTMENT_OTHER)
Admission: EM | Admit: 2019-01-17 | Discharge: 2019-01-17 | Disposition: A | Discharge status: Home / Self Care | Payer: Managed Care, Other (non HMO) | Attending: Emergency Medicine | Admitting: Emergency Medicine

## 2019-01-17 ENCOUNTER — Other Ambulatory Visit: Payer: Self-pay

## 2019-01-17 ENCOUNTER — Emergency Department (HOSPITAL_BASED_OUTPATIENT_CLINIC_OR_DEPARTMENT_OTHER): Payer: Managed Care, Other (non HMO)

## 2019-01-17 DIAGNOSIS — F17210 Nicotine dependence, cigarettes, uncomplicated: Secondary | ICD-10-CM | POA: Diagnosis not present

## 2019-01-17 DIAGNOSIS — Y929 Unspecified place or not applicable: Secondary | ICD-10-CM | POA: Insufficient documentation

## 2019-01-17 DIAGNOSIS — S8011XA Contusion of right lower leg, initial encounter: Secondary | ICD-10-CM | POA: Diagnosis not present

## 2019-01-17 DIAGNOSIS — W19XXXA Unspecified fall, initial encounter: Secondary | ICD-10-CM

## 2019-01-17 DIAGNOSIS — W109XXA Fall (on) (from) unspecified stairs and steps, initial encounter: Secondary | ICD-10-CM | POA: Diagnosis not present

## 2019-01-17 DIAGNOSIS — Z79899 Other long term (current) drug therapy: Secondary | ICD-10-CM | POA: Insufficient documentation

## 2019-01-17 DIAGNOSIS — Y999 Unspecified external cause status: Secondary | ICD-10-CM | POA: Diagnosis not present

## 2019-01-17 DIAGNOSIS — Y9302 Activity, running: Secondary | ICD-10-CM | POA: Diagnosis not present

## 2019-01-17 DIAGNOSIS — S8991XA Unspecified injury of right lower leg, initial encounter: Secondary | ICD-10-CM | POA: Diagnosis present

## 2019-01-17 HISTORY — DX: Disorder of thyroid, unspecified: E07.9

## 2019-01-17 HISTORY — DX: Thyrotoxicosis with diffuse goiter without thyrotoxic crisis or storm: E05.00

## 2019-01-17 NOTE — ED Notes (Signed)
ED Provider at bedside. 

## 2019-01-17 NOTE — Discharge Instructions (Signed)

## 2019-01-17 NOTE — ED Triage Notes (Signed)
[address] going up stairs yesterday.  Pain, swelling and bruising to right lower leg.  Pt is ambulatory with minimal limp but wants to have it checked.

## 2019-01-17 NOTE — ED Provider Notes (Signed)
[facility] EMERGENCY DEPARTMENT Provider Note   CSN: [id number] Arrival date & time: 01/17/19  1144     History   Chief Complaint Chief Complaint  Patient presents with  . Leg Injury    HPI Ralph Harrell is a 28 y.o. male.     HPI   28 year old male with history of bipolar disorder, depression, Graves' disease, who presents the emergency department today complaining of right leg pain.  States he was running up the steps yesterday when his foot turned under him, he tripped and hit his lower leg on the stair.  Since then he has had pain and swelling.  States he is unable to get the swelling down today.  Patient states pain is constant, it started suddenly.  He denies any other injuries.  Past Medical History:  Diagnosis Date  . Bipolar 1 disorder ([place])   . Depression   . Graves disease   . Thyroid disease     Patient Active Problem List   Diagnosis Date Noted  . Hyperthyroidism 01/30/2018  . Anxiety 01/30/2018    Past Surgical History:  Procedure Laterality Date  . ANKLE FRACTURE SURGERY          Home Medications    Prior to Admission medications   Medication Sig Start Date End Date Taking? Authorizing Provider  atenolol (TENORMIN) 25 MG tablet Take by mouth daily.    [provider]  buPROPion (WELLBUTRIN SR) 150 MG 12 hr tablet Take 150 mg by mouth daily.  12/14/17   [provider]  FLUoxetine (PROZAC) 40 MG capsule Take 40 mg by mouth daily. 12/14/17   [provider]  hydrOXYzine (ATARAX/VISTARIL) 50 MG tablet Take 50 mg by mouth every 4 (four) hours as needed.    [provider]    Family History Family History  Problem Relation Age of Onset  . Thyroid disease Maternal Grandmother     Social History Social History   Tobacco Use  . Smoking status: Current Some Day Smoker    Types: Cigarettes    Last attempt to quit: 04/06/2018    Years since quitting: 0.7  . Smokeless tobacco: Never Used   Substance Use Topics  . Alcohol use: Yes    Comment: occ  . Drug use: Not Currently     Allergies   Patient has no known allergies.   Review of Systems Review of Systems  Musculoskeletal:       Right leg pain  Neurological:       No head trauma     Physical Exam Updated Vital Signs BP 131/88 (BP Location: Right Arm)   Pulse 69   Temp 97.9 F (36.6 C) (Oral)   Resp 16   Ht 5\' 11"  (1.803 m)   Wt 73.3 kg   SpO2 98%   BMI 22.52 kg/m   Physical Exam Constitutional:      General: He is not in acute distress.    Appearance: He is well-developed.  Eyes:     Conjunctiva/sclera: Conjunctivae normal.  Cardiovascular:     Rate and Rhythm: Normal rate and regular rhythm.  Pulmonary:     Effort: Pulmonary effort is normal.     Breath sounds: Normal breath sounds.  Musculoskeletal:     Comments: Superficial abrasion and soft tissue swelling to right lower leg with TTP along to the distal tib/fib. No ttp to the medial/lateral malleoli or foot. NVI distally.   Skin:    General: Skin is warm and  dry.  Neurological:     Mental Status: He is alert and oriented to person, place, and time.      ED Treatments / Results  Labs (all labs ordered are listed, but only abnormal results are displayed) Labs Reviewed - No data to display  EKG None  Radiology No results found.  Procedures Procedures (including critical care time)  Medications Ordered in ED Medications - No data to display   Initial Impression / Assessment and Plan / ED Course  I have reviewed the triage vital signs and the nursing notes.  Pertinent labs & imaging results that were available during my care of the patient were reviewed by me and considered in my medical decision making (see chart for details).      Final Clinical Impressions(s) / ED Diagnoses   Final diagnoses:  Fall, initial encounter  Contusion of right lower extremity, initial encounter   28 year old male with mechanical fall  complaining of right leg pain.  On exam, he has a superficial abrasion and soft tissue swelling to right lower leg with TTP along to the distal tib/fib. No ttp to the medial/lateral malleoli or foot. NVI distally.   Xray right tib/fib negative for fracture.   Advised nsaids, tylenol, rice therapy. Advised to f/u with pcp as needed and return if worse.   ED Discharge Orders    None       Karrie Meres, PA-C 01/17/19 1326    Virgina Norfolk, DO 01/17/19 1349

## 2019-11-02 IMAGING — NM NM THYROID IMAGING W/ UPTAKE MULTI (4&24 HR)
4 series · 4 of 4 positions shown · non-contrast
Comparison: None

CLINICAL DATA: Hyperthyroidism, elevated blood pressure, neck
swelling, nervousness, difficulty sleeping, hand tremors, sore
throat, heart palpitations, constipation, smoker

EXAM:
THYROID SCAN AND UPTAKE - 4 AND 24 HOURS
TECHNIQUE: Following the per oral administration of X-7Q7 sodium iodide, the
patient returned at 4 and 24 hours and uptake measurements were
acquired with the uptake probe centered on the neck. Thyroid imaging
was performed following the intravenous administration of Sc-RRm
Pertechnetate.
RADIOPHARMACEUTICALS:  10.2 mCi Uechnetium-DDm pertechnetate IV and
8.9 microCuries X-7Q7 sodium iodide orally

[tc thyroid uptake tc99 · 3.10mm/px · 1 of 1 slices shown (1 of 4)]
[im 1/1]
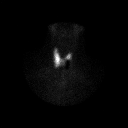

[tc thyroid uptake tc99 · 3.10mm/px · 1 of 1 slices shown (2 of 4)]
[im 1/1]
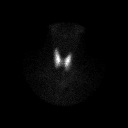

[tc thyroid uptake tc99 · 3.10mm/px · 1 of 1 slices shown (3 of 4)]
[im 1/1]
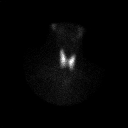

[tc thyroid uptake tc99 · 3.10mm/px · 1 of 1 slices shown (4 of 4)]
[im 1/1]
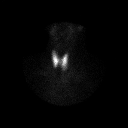

[4 of 4 positions shown; findings below may reference images not displayed]

FINDINGS: Homogeneous tracer distribution in both thyroid lobes.

No focal areas of increased or decreased tracer localization..

4 hour X-7Q7 uptake = 14.1% (normal 5-20%)

24 hour X-7Q7 uptake = 35.9% (normal 10-30%)
IMPRESSION: Mildly elevated 24 hour radio iodine uptake consistent with
hyperthyroidism.

Normal thyroid scan.

Findings consistent with Graves disease.

## 2020-11-10 ENCOUNTER — Ambulatory Visit (HOSPITAL_COMMUNITY)
Admission: EM | Admit: 2020-11-10 | Discharge: 2020-11-10 | Disposition: A | Discharge status: Home / Self Care | Payer: Managed Care, Other (non HMO) | Attending: Medical Oncology | Admitting: Medical Oncology

## 2020-11-10 ENCOUNTER — Other Ambulatory Visit: Payer: Self-pay

## 2020-11-10 ENCOUNTER — Encounter (HOSPITAL_COMMUNITY): Payer: Self-pay

## 2020-11-10 DIAGNOSIS — M25512 Pain in left shoulder: Secondary | ICD-10-CM | POA: Diagnosis not present

## 2020-11-10 MED ORDER — CYCLOBENZAPRINE HCL 5 MG PO TABS: 5.0000 mg | ORAL_TABLET | Freq: Three times a day (TID) | ORAL | 0 refills | Status: AC | PRN

## 2020-11-10 MED ORDER — NAPROXEN 500 MG PO TABS: 500.0000 mg | ORAL_TABLET | Freq: Two times a day (BID) | ORAL | 0 refills | Status: AC

## 2020-11-10 NOTE — ED Triage Notes (Signed)
Pt in with c/o left shoulder injury that occurred a few weeks ago when he was moving equipment and he fell and landed wrong   Pt has not had otc med for pain

## 2020-11-10 NOTE — ED Provider Notes (Signed)
[facility]    CSN: [id number] Arrival date & time: 11/10/20  1543      History   Chief Complaint Chief Complaint  Patient presents with   Shoulder Injury    HPI WAYBURN SHALER is a 30 y.o. male.   HPI  Shoulder Injury: Pt reports left shoulder injury that occurred a few weeks ago when he was moving equipment. He reports that he feel on the shoulder and landed wrong. No bone pain but has some muscle pains with ROM exercises- mainly with external rotation. He has not tried anything for pain. No swelling, loss of grip strength, numbness/tingling.   Past Medical History:  Diagnosis Date   Bipolar 1 disorder (HCC)    Depression    Graves disease    Thyroid disease     Patient Active Problem List   Diagnosis Date Noted   Hyperthyroidism 01/30/2018   Anxiety 01/30/2018    Past Surgical History:  Procedure Laterality Date   ANKLE FRACTURE SURGERY         Home Medications    Prior to Admission medications   Medication Sig Start Date End Date Taking? Authorizing Provider  atenolol (TENORMIN) 25 MG tablet Take by mouth daily.    [provider]  buPROPion (WELLBUTRIN SR) 150 MG 12 hr tablet Take 150 mg by mouth daily.  12/14/17   [provider]  FLUoxetine (PROZAC) 40 MG capsule Take 40 mg by mouth daily. 12/14/17   [provider]  hydrOXYzine (ATARAX/VISTARIL) 50 MG tablet Take 50 mg by mouth every 4 (four) hours as needed.    [provider]    Family History Family History  Problem Relation Age of Onset   Thyroid disease Maternal Grandmother     Social History Social History   Tobacco Use   Smoking status: Some Days    Types: Cigarettes    Last attempt to quit: 04/06/2018    Years since quitting: 2.6   Smokeless tobacco: Never  Vaping Use   Vaping Use: Never used  Substance Use Topics   Alcohol use: Yes    Comment: occ   Drug use: Not Currently     Allergies   Patient has no known  allergies.   Review of Systems Review of Systems  As stated above in HPI Physical Exam Triage Vital Signs ED Triage Vitals  Enc Vitals Group     BP 11/10/20 1651 119/74     Pulse Rate 11/10/20 1651 71     Resp 11/10/20 1651 18     Temp 11/10/20 1651 98.5 F (36.9 C)     Temp Source 11/10/20 1651 Oral     SpO2 11/10/20 1651 100 %     Weight --      Height --      Head Circumference --      Peak Flow --      Pain Score 11/10/20 1650 0     Pain Loc --      Pain Edu? --      Excl. in GC? --    No data found.  Updated Vital Signs BP 119/74 (BP Location: Right Arm)   Pulse 71   Temp 98.5 F (36.9 C) (Oral)   Resp 18   SpO2 100%    Physical Exam Vitals and nursing note reviewed.  Constitutional:      General: He is not in acute distress.    Appearance: Normal appearance. He is not ill-appearing, toxic-appearing or diaphoretic.  HENT:     Head: Normocephalic and atraumatic.  Cardiovascular:     Rate and Rhythm: Normal rate and regular rhythm.     Heart sounds: Normal heart sounds.  Pulmonary:     Effort: Pulmonary effort is normal.     Breath sounds: Normal breath sounds.  Musculoskeletal:        General: Normal range of motion.     Right shoulder: Normal.     Left shoulder: No swelling, deformity or bony tenderness. Normal strength. Normal pulse.       Arms:     Comments: Normal empty can testing bilaterally, normal Apley scratch testing.   Neurological:     Mental Status: He is alert.     UC Treatments / Results  Labs (all labs ordered are listed, but only abnormal results are displayed) Labs Reviewed - No data to display  EKG   Radiology No results found.  Procedures Procedures (including critical care time)  Medications Ordered in UC Medications - No data to display  Initial Impression / Assessment and Plan / UC Course  I have reviewed the triage vital signs and the nursing notes.  Pertinent labs & imaging results that were available  during my care of the patient were reviewed by me and considered in my medical decision making (see chart for details).     New.  Likely a strain.  Discussed with patient.  For now we will send in muscle relaxers and naproxen.  Discussed red flag signs and symptoms.  If symptoms fail to improve over the next 2 weeks I would recommend orthopedic follow-up.  Avoid lifting over 10 pounds for the next 2 weeks. Final Clinical Impressions(s) / UC Diagnoses   Final diagnoses:  None   Discharge Instructions   None    ED Prescriptions   None    PDMP not reviewed this encounter.   Rushie Chestnut, [place] 11/10/20 1759

## 2023-03-27 ENCOUNTER — Emergency Department
Admission: EM | Admit: 2023-03-27 | Discharge: 2023-03-27 | Disposition: A | Discharge status: Home / Self Care | Payer: 59 | Attending: Emergency Medicine | Admitting: Emergency Medicine

## 2023-03-27 ENCOUNTER — Other Ambulatory Visit: Payer: Self-pay

## 2023-03-27 ENCOUNTER — Emergency Department: Payer: 59

## 2023-03-27 DIAGNOSIS — M25571 Pain in right ankle and joints of right foot: Secondary | ICD-10-CM | POA: Insufficient documentation

## 2023-03-27 DIAGNOSIS — G8911 Acute pain due to trauma: Secondary | ICD-10-CM | POA: Diagnosis not present

## 2023-03-27 DIAGNOSIS — X501XXA Overexertion from prolonged static or awkward postures, initial encounter: Secondary | ICD-10-CM | POA: Diagnosis not present

## 2023-03-27 MED ORDER — IBUPROFEN 800 MG PO TABS
800.0000 mg | ORAL_TABLET | Freq: Once | ORAL | Status: AC
  Administered 2023-03-27: 800 mg via ORAL
  Filled 2023-03-27: qty 1

## 2023-03-27 NOTE — ED Triage Notes (Signed)
Pt here with a right ankle injury. Pt states he was taking his dog out and he pulled him and he tripped and landed on his right ankle. Pt in severe pain and has not been able to bear weight on his ankle since.

## 2023-03-27 NOTE — ED Notes (Signed)
Pt taken to xray and then they will take to ED 44

## 2023-03-27 NOTE — ED Notes (Signed)
Ice pack applied to R ankle.

## 2023-03-27 NOTE — Discharge Instructions (Addendum)
Your evaluated in the ED for right ankle pain.  Your x-ray is normal.  There is some soft tissue swelling in which he will be placed in an ankle brace and provided with crutches.  Nonweightbearing status for 3 days and then gradually bear weight as tolerated.   Follow-up with orthopedics if symptoms do not improve.  Rest.  Apply ice to the affected area and keep ankle elevated above heart level throughout the day.  Take ibuprofen for pain as needed.

## 2023-03-27 NOTE — ED Provider Notes (Signed)
[facility] Emergency Department Provider Note     Event Date/Time   First MD Initiated Contact with Patient 03/27/23 1148     (approximate)   History   Ankle Injury   HPI  Ralph Harrell is a 32 y.o. male presents to the ED for evaluation of right ankle injury.  Patient reports he tripped over his dog rolling his right ankle earlier today.  Localized pain to the lateral aspect of ankle.  Patient is not able to bear weight on this ankle.  Denies numbness.     Physical Exam   Triage Vital Signs: ED Triage Vitals  Encounter Vitals Group     BP 03/27/23 1147 110/88     Systolic BP Percentile --      Diastolic BP Percentile --      Pulse Rate 03/27/23 1147 85     Resp 03/27/23 1147 (!) 22     Temp 03/27/23 1147 (!) 97.5 F (36.4 C)     Temp Source 03/27/23 1147 Oral     SpO2 03/27/23 1147 99 %     Weight 03/27/23 1146 161 lb 9.6 oz (73.3 kg)     Height 03/27/23 1146 5\' 11"  (1.803 m)     Head Circumference --      Peak Flow --      Pain Score 03/27/23 1146 10     Pain Loc --      Pain Education --      Exclude from Growth Chart --     Most recent vital signs: Vitals:   03/27/23 1147  BP: 110/88  Pulse: 85  Resp: (!) 22  Temp: (!) 97.5 F (36.4 C)  SpO2: 99%    General Awake, no distress.  HEENT NCAT. PERRL. EOMI. No rhinorrhea. Mucous membranes are moist.  CV:  Good peripheral perfusion.  RESP:  Normal effort.  ABD:  No distention.  Other:  Right ankle reveals mild edema over the lateral malleolus.  Tenderness to palpation.  ROM limited due to pain.  Neurovascular status intact.  No visible deformity.  Brisk capillary refills.   ED Results / Procedures / Treatments   Labs (all labs ordered are listed, but only abnormal results are displayed) Labs Reviewed - No data to display  RADIOLOGY  I personally viewed and evaluated these images as part of my medical decision making, as well as reviewing the written report by the  radiologist.  ED Provider Interpretation: Right ankle appears normal.  No obvious bony abnormality will confirm with final radiology read.  DG Ankle Complete Right Result Date: 03/27/2023 CLINICAL DATA:  32 year old male status post trip and fall.  Pain. EXAM: RIGHT ANKLE - COMPLETE 3+ VIEW COMPARISON:  Right tib fib series 01/17/2019. FINDINGS: Bone mineralization is within normal limits. Maintained mortise joint alignment. Talar dome appears intact. Positive for ankle joint effusion on the lateral. Distal right fibula now appears intact. There is asymmetric soft tissue swelling over the lateral malleolus. Distal tibia, talus and calcaneus appear intact. Grossly intact visible bones of the right foot. IMPRESSION: Lateral soft tissue swelling and ankle joint effusion. But no acute fracture or dislocation identified about the right ankle. Electronically Signed   By: Odessa Fleming M.D.   On: 03/27/2023 12:36    PROCEDURES:  Critical Care performed: No  Procedures   MEDICATIONS ORDERED IN ED: Medications  ibuprofen (ADVIL) tablet 800 mg (800 mg Oral Given 03/27/23 1246)     IMPRESSION / MDM /  ASSESSMENT AND PLAN / ED COURSE  I reviewed the triage vital signs and the nursing notes.                               32 y.o. male presents to the emergency department for evaluation and treatment of ankle pain. See HPI for further details.   Differential diagnosis includes, but is not limited to x-ray, dislocation, sprain  Patient's presentation is most consistent with acute complicated illness / injury requiring diagnostic workup.  Patient is alert and oriented.  He is hemodynamically stable.   X-ray reveals lateral soft tissue swelling and ankle joint effusion which is consistent with physical exam findings.  Presentation is clinically consistent with an ankle sprain.  He will be placed in an ankle brace and provided with crutches.  He is encouraged to follow-up with orthopedic if symptoms do not  improve.  I suspect this will resolve with time.  Encouraged ibuprofen for pain as needed and RICE therapy at home provided.  The patient is in stable and satisfactory condition for discharge home. ED precautions discussed. All questions and concerns were addressed during this ED visit.     FINAL CLINICAL IMPRESSION(S) / ED DIAGNOSES   Final diagnoses:  Acute right ankle pain    Rx / DC Orders   ED Discharge Orders     None       Note:  This document was prepared using Dragon voice recognition software and may include unintentional dictation errors.    Romeo Apple, Armanii Pressnell A, PA-C 03/27/23 1302    Sharyn Creamer, MD 03/27/23 [phone]
